# Patient Record
Sex: Male | Born: 2000
Health system: Southern US, Community
[De-identification: ages and names within clinical notes are randomized; demographics above are authoritative.]

## PROBLEM LIST (undated history)

## (undated) DIAGNOSIS — J189 Pneumonia, unspecified organism: Secondary | ICD-10-CM

## (undated) DIAGNOSIS — Q231 Congenital insufficiency of aortic valve: Secondary | ICD-10-CM

## (undated) DIAGNOSIS — I10 Essential (primary) hypertension: Secondary | ICD-10-CM

## (undated) DIAGNOSIS — Q2381 Bicuspid aortic valve: Secondary | ICD-10-CM

## (undated) HISTORY — DX: Essential (primary) hypertension: I10

---
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
## 2020-02-19 DIAGNOSIS — U071 COVID-19: Secondary | ICD-10-CM | POA: Diagnosis not present

## 2020-05-16 DIAGNOSIS — M545 Low back pain, unspecified: Secondary | ICD-10-CM | POA: Diagnosis not present

## 2020-05-16 DIAGNOSIS — S39012A Strain of muscle, fascia and tendon of lower back, initial encounter: Secondary | ICD-10-CM | POA: Diagnosis not present

## 2020-05-16 DIAGNOSIS — T1490XA Injury, unspecified, initial encounter: Secondary | ICD-10-CM | POA: Diagnosis not present

## 2020-07-23 ENCOUNTER — Encounter: Payer: Self-pay | Admitting: Student

## 2020-07-23 ENCOUNTER — Ambulatory Visit (INDEPENDENT_AMBULATORY_CARE_PROVIDER_SITE_OTHER): Payer: 59 | Admitting: Student

## 2020-07-23 VITALS — BP 143/64 | HR 98 | Temp 98.2°F | Ht 69.0 in | Wt 220.3 lb

## 2020-07-23 DIAGNOSIS — Z8709 Personal history of other diseases of the respiratory system: Secondary | ICD-10-CM

## 2020-07-23 DIAGNOSIS — Q2381 Bicuspid aortic valve: Secondary | ICD-10-CM | POA: Insufficient documentation

## 2020-07-23 DIAGNOSIS — I10 Essential (primary) hypertension: Secondary | ICD-10-CM

## 2020-07-23 DIAGNOSIS — Z114 Encounter for screening for human immunodeficiency virus [HIV]: Secondary | ICD-10-CM | POA: Diagnosis not present

## 2020-07-23 DIAGNOSIS — Q231 Congenital insufficiency of aortic valve: Secondary | ICD-10-CM

## 2020-07-23 DIAGNOSIS — E669 Obesity, unspecified: Secondary | ICD-10-CM | POA: Diagnosis not present

## 2020-07-23 HISTORY — DX: Personal history of other diseases of the respiratory system: Z87.09

## 2020-07-23 NOTE — Patient Instructions (Signed)
John Clements,  It was a pleasure meeting you today in clinic.  You have elevated blood pressures in clinic today. There may be a component of "white coat hypertension" since your blood pressure is usually below 140/90 at home. We can hold off on medication at this time, but I strongly recommend that you continue checking your blood pressure at home and limit your salt intake. Please keep up with the running and weight lifting. We will need to keep a close eye on this in the future. We will collect some blood work today and I will call you with the results.  Sincerely, Dr. Jasmine December, MD

## 2020-07-23 NOTE — Progress Notes (Signed)
CC: New patient visit   HPI:  Mr.Jari W Carithers is a 20 y.o. man with past medical history of obesity, hypertension, asthma and functionally bicuspid aortic valve who presents to clinic as a new patient to establish care and obtain authorization for military enrollment. Refer to problem list for charting of this encounter.  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 He graduated from high school in 2020 and is currently planning to join the [employer] as soon as possible. He denies current tobacco use, recreational drug use, or alcohol consumption. He is currently sexually active with male partners and uses condoms for contraception. He lifts weights four to five times weekly and runs two to three times weekly.  Review of Systems:  Review of Systems  Constitutional:  Positive for weight loss. Negative for chills and fever.  HENT:  Negative for nosebleeds and sore throat.   Eyes:  Negative for blurred vision and pain.  Respiratory:  Negative for cough, shortness of breath and wheezing.   Cardiovascular:  Negative for chest pain, palpitations and leg swelling.  Gastrointestinal:  Negative for abdominal pain, diarrhea, nausea and vomiting.  Genitourinary:  Negative for dysuria and hematuria.  Musculoskeletal:  Negative for falls and myalgias.  Skin:  Negative for itching and rash.  Neurological:  Negative for seizures  and headaches.  Psychiatric/Behavioral:  Negative for depression, hallucinations and suicidal ideas.    Physical Exam:  Vitals:   07/23/20 1030 07/23/20 1111  BP: (!) 147/79 (!) 143/64  Pulse: 100 98  Temp: 98.2 F (36.8 C)   TempSrc: Oral   SpO2: 100%   Weight: 220 lb 4.8 oz (99.9 kg)   Height: 5\' 9"  (1.753 m)    Physical Exam Vitals reviewed.  Constitutional:      General: He is not in acute distress.    Appearance: He is not ill-appearing, toxic-appearing or diaphoretic.  HENT:     Head: Normocephalic and atraumatic.     Mouth/Throat:     Mouth: Mucous membranes are moist.     Pharynx: Oropharynx is clear.  Eyes:     Extraocular Movements: Extraocular movements intact.     Conjunctiva/sclera: Conjunctivae normal.  Cardiovascular:     Rate and Rhythm: Normal rate and regular rhythm.     Pulses: Normal pulses.     Heart sounds: Normal heart sounds. No murmur heard. Pulmonary:     Effort: Pulmonary effort is normal.     Breath sounds: Normal breath sounds. No wheezing.  Abdominal:     General: Abdomen is flat. Bowel sounds are normal.     Palpations: Abdomen is soft.     Tenderness: no abdominal tenderness  Musculoskeletal:        General: Normal range of motion.     Cervical back: Normal range of motion and neck supple.     Right lower leg: No edema.     Left lower leg: No edema.  Skin:    General: Skin is warm and dry.  Capillary Refill: Capillary refill takes less than 2 seconds.  Neurological:     General: No focal deficit present.     Mental Status: He is alert and oriented to person, place, and time. Mental status is at baseline.  Psychiatric:        Mood and Affect: Mood normal.        Behavior: Behavior normal.        Thought Content: Thought content normal.        Judgment: Judgment normal.   Assessment & Plan:   See Encounters Tab for problem based charting.  Patient discussed with Dr. Oswaldo Done

## 2020-07-23 NOTE — Assessment & Plan Note (Signed)
Patient has history of childhood asthma with exacerbations in winter months. Patient prescribed albuterol inhaler approximately ten years ago with no need for continuation. Patient denies wheezing, cough, shortness of breath. On examination, lungs are clear to auscultation bilaterally with patient breathing comfortably on room air. Patient's asthma is a resolved condition with no impact on his current activity tolerance. -No further evaluation or management of this condition

## 2020-07-23 NOTE — Assessment & Plan Note (Signed)
Patient has had significant intentional weight loss (46 pounds in approximately three years) in the setting of active lifestyle with 4-5 times weekly weight lifting and 2-3 times weekly running. BMI today 32.53 although somewhat likely unreliable metric given his muscle mass.  -Continue with exercise -Dietary modifications

## 2020-07-23 NOTE — Assessment & Plan Note (Signed)
Patient has history of stage 1 hypertension noted at prior office visits. He is currently not on an antihypertensive. He checks his blood pressure at home approximately two to three times weekly and reports that the systolic readings are typically in the 120s and 130s while the diastolic readings are persistently less than 80. He has been working hard with the diet modifications, salt restriction, weight lifting and cardio with a nearly forty pound weight loss in the last few years.   Blood pressure today elevated to 147/79.  Shared decision making conversation with patient today. Patient desires to continue with dietary modifications, exercise, weight loss and salt reduction to reduce his blood pressure. He will continue to check blood pressure at home and keep a log of his readings with plan to contact our clinic for persistent systolic elevation >140 or diastolic >90. No treatment at this time. We will obtain a BMP to evaluate for potential secondary causes for hypertension.

## 2020-07-23 NOTE — Assessment & Plan Note (Signed)
Patient noted to have a functionally bicuspid aortic valve due to fusion of leaflets detected on cardiac MRI/echocardiogram following a football injury. Patient evaluated by [facility] cardiology with recommendation for follow-up in three to five years. Patient is asymptomatic with no chest pain, palpitations, shortness of breath or exercise intolerance. Physical examination without appreciable murmur. Patient's condition is an incidental finding with no clinical significance at this time. Patient requires no activity restrictions. Plan to see cardiologist for follow-up in 3 to 5 years.

## 2020-07-23 NOTE — Assessment & Plan Note (Signed)
-  HIV screening test today

## 2020-07-24 LAB — BMP8+ANION GAP
Anion Gap: 17 mmol/L (ref 10.0–18.0)
BUN/Creatinine Ratio: 12 (ref 9–20)
BUN: 12 mg/dL (ref 6–20)
CO2: 21 mmol/L (ref 20–29)
Calcium: 9.9 mg/dL (ref 8.7–10.2)
Chloride: 102 mmol/L (ref 96–106)
Creatinine, Ser: 1 mg/dL (ref 0.76–1.27)
Glucose: 77 mg/dL (ref 65–99)
Potassium: 4.6 mmol/L (ref 3.5–5.2)
Sodium: 140 mmol/L (ref 134–144)
eGFR: 110 mL/min/{1.73_m2} (ref >59)

## 2020-07-24 LAB — HIV ANTIBODY (ROUTINE TESTING W REFLEX): HIV Screen 4th Generation wRfx: NONREACTIVE

## 2020-07-24 NOTE — Progress Notes (Signed)
Internal Medicine Clinic Attending  Case discussed with Dr. Johnson  At the time of the visit.  We reviewed the resident's history and exam and pertinent patient test results.  I agree with the assessment, diagnosis, and plan of care documented in the resident's note.  

## 2020-08-19 ENCOUNTER — Encounter: Payer: Self-pay | Admitting: Internal Medicine

## 2020-08-19 NOTE — Progress Notes (Deleted)
Asdf jkl;

## 2020-09-02 ENCOUNTER — Encounter: Payer: Self-pay | Admitting: Internal Medicine

## 2020-09-02 ENCOUNTER — Telehealth: Payer: Self-pay | Admitting: Internal Medicine

## 2020-09-02 NOTE — Telephone Encounter (Signed)
RTC, patient states letter from MD was mailed to his old address and he was unable to get the letter.  Patient came to pick up another copy of the letter, but the copy was not signed.  Letter printed out and signed by Dr. Mayford Knife.  Patient asking if letter can be faxed to [phone] and then mailed to his new address:  [address] [place], [place] [address]  Faxed to above number w/ receipt confirmation obtained and placed in outgoing mail to patient today. SChaplin, RN,BSN

## 2020-09-02 NOTE — Telephone Encounter (Signed)
Pt calling to report the Letter that was given to him for his [employer] was not signed.  Per the patient his Letter must be signed in order for it to be accepted by the [employer] Recruiter, once this has been completed he will be accepted into the [employer].  Please call the patient back.

## 2020-09-17 DIAGNOSIS — R062 Wheezing: Secondary | ICD-10-CM | POA: Diagnosis not present

## 2020-10-16 DIAGNOSIS — R0602 Shortness of breath: Secondary | ICD-10-CM | POA: Diagnosis not present

## 2020-10-16 DIAGNOSIS — Z7182 Exercise counseling: Secondary | ICD-10-CM | POA: Diagnosis not present

## 2020-10-16 DIAGNOSIS — J4520 Mild intermittent asthma, uncomplicated: Secondary | ICD-10-CM | POA: Diagnosis not present

## 2020-10-17 DIAGNOSIS — R0602 Shortness of breath: Secondary | ICD-10-CM | POA: Diagnosis not present

## 2020-10-19 DIAGNOSIS — R369 Urethral discharge, unspecified: Secondary | ICD-10-CM | POA: Diagnosis not present

## 2020-10-19 DIAGNOSIS — Z113 Encounter for screening for infections with a predominantly sexual mode of transmission: Secondary | ICD-10-CM | POA: Diagnosis not present
